# Patient Record
Sex: Male | Born: 1970 | Race: White | Hispanic: No
Health system: Midwestern US, Community
[De-identification: ages and names within clinical notes are randomized; demographics above are authoritative.]

## PROBLEM LIST (undated history)

## (undated) HISTORY — PX: APPENDECTOMY: SHX54

## (undated) HISTORY — PX: CHOLECYSTECTOMY: SHX55

---

## 2014-10-29 ENCOUNTER — Emergency Department (HOSPITAL_COMMUNITY)
Admission: EM | Admit: 2014-10-29 | Discharge: 2014-10-30 | Disposition: A | Discharge status: Home / Self Care | Attending: Emergency Medicine | Admitting: Emergency Medicine

## 2014-10-29 ENCOUNTER — Encounter (HOSPITAL_COMMUNITY): Payer: Self-pay | Admitting: Emergency Medicine

## 2014-10-29 DIAGNOSIS — L0201 Cutaneous abscess of face: Secondary | ICD-10-CM | POA: Diagnosis not present

## 2014-10-29 DIAGNOSIS — Z880 Allergy status to penicillin: Secondary | ICD-10-CM | POA: Diagnosis not present

## 2014-10-29 DIAGNOSIS — Z720 Tobacco use: Secondary | ICD-10-CM | POA: Diagnosis not present

## 2014-10-29 NOTE — ED Notes (Signed)
Pt c/o abscess to the rt side of mouth.

## 2014-10-29 NOTE — ED Provider Notes (Signed)
CSN: [id number]     Arrival date & time 10/29/14  2204 History  This chart was scribed for Devoria Albe, MD by Phillis Haggis, ED Scribe. This patient was seen in room APA17/APA17 and patient care was started at 11:38 PM.   Chief Complaint  Patient presents with  . Abscess   The history is provided by the patient. No language interpreter was used.  HPI Comments: Ralph Rivera is a 44 y.o. male who presents to the Emergency Department complaining of a gradually worsening wound to the bottom right of the mouth onset today. Pt states that he was helping a friend move in and was in the storage area underneath the house yesterday when he believes that he got bit by a spider, but did not feel a specific bite. States that he woke up with the bump on his face this morning that was initially a small red bump that has gotten worse and he can feel it on the inside of his mouth; states that it has been draining. Reports myalgias, sore throat, pain with swallowing, chills, but unsure of fever. Denies nausea or vomiting.    Reports smoking 5-10 cigarettes per day. 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 Denies daily medications.   States that he is from [place] and sees [facility] in [place].  History reviewed. No pertinent past medical history. Past Surgical History  Procedure Laterality Date  . Cholecystectomy     No family history on file. History  Substance Use Topics  . Smoking status: Current Every Day Smoker    Types: Cigarettes  . Smokeless tobacco: Not on file  . Alcohol Use: No  active duty [employer] Smokes 1/2 ppd  Review of Systems  Constitutional: Positive for fever and chills.  HENT: Positive for mouth sores and sore throat.   Gastrointestinal: Negative for nausea and vomiting.  Musculoskeletal: Positive for myalgias.  Skin: Positive for wound.  All other systems reviewed and are negative.   Allergies  Penicillins  Home Medications   Prior to Admission  medications   Medication Sig Start Date End Date Taking? Authorizing Provider  sulfamethoxazole-trimethoprim (BACTRIM DS,SEPTRA DS) 800-160 MG per tablet Take 1 tablet by mouth 2 (two) times daily. 10/30/14   Devoria Albe, MD   BP 151/84 mmHg  Pulse 91  Temp(Src) 99.1 F (37.3 C)  Resp 20  Ht  (1.778 m)  Wt 170 lb (77.111 kg)  BMI 24.39 kg/m2  SpO2 100%  Vital signs normal except for low-grade fever   Physical Exam  Constitutional: He is oriented to person, place, and time. He appears well-developed and well-nourished.  Non-toxic appearance. He does not appear ill. No distress.  HENT:  Head: Normocephalic and atraumatic.  Right Ear: External ear normal.  Left Ear: External ear normal.  Nose: Nose normal. No mucosal edema or rhinorrhea.  Mouth/Throat: Oropharynx is clear and moist and mucous membranes are normal. No dental abscesses or uvula swelling.  Purulent drainage and scabbed area to the right edge of mouth about 1 cm in size; no corresponding lesion to palpation on inside of mouth. There is no discrete induration in the subcutaneous tissue.  Eyes: Conjunctivae and EOM are normal. Pupils are equal, round, and reactive to light.  Neck: Full passive range of motion without pain.  Pulmonary/Chest: Effort normal. No respiratory distress. He has no rhonchi. He exhibits no crepitus.  Abdominal: Normal appearance.  Musculoskeletal: Normal range of motion.  Moves all extremities well.   Neurological:  He is alert and oriented to person, place, and time. He has normal strength. No cranial nerve deficit.  Skin: Skin is warm, dry and intact. No rash noted. No erythema. No pallor.  Psychiatric: He has a normal mood and affect. His speech is normal and behavior is normal. His mood appears not anxious.  Nursing note and vitals reviewed.      ED Course  Procedures (including critical care time)  Medications  sulfamethoxazole-trimethoprim (BACTRIM DS,SEPTRA DS) 800-160 MG per  tablet 1 tablet (not administered)  ibuprofen (ADVIL,MOTRIN) tablet 600 mg (not administered)  acetaminophen (TYLENOL) tablet 1,000 mg (not administered)    DIAGNOSTIC STUDIES: Oxygen Saturation is 100% on RA, normal by my interpretation.    COORDINATION OF CARE: 11:44 PM-Discussed treatment plan which includes wound culture, anti-biotics, and warm compresses with pt at bedside and pt agreed to plan.   Patient's wound was draining. I swabbed the drainage to obtain for culture. I discussed with the patient this is most likely a MRSA type infection. His temperature was rechecked and remained low-grade fever at 99.8. He was started on Septra DS.  Labs Review Labs Reviewed  WOUND CULTURE    Imaging Review No results found.   EKG Interpretation None      MDM   Final diagnoses:  Abscess of face   New Prescriptions   SULFAMETHOXAZOLE-TRIMETHOPRIM (BACTRIM DS,SEPTRA DS) 800-160 MG PER TABLET    Take 1 tablet by mouth 2 (two) times daily.    Plan discharge  Devoria AlbeIva Cassie Henkels, MD, FACEP  I personally performed the services described in this documentation, which was scribed in my presence. The recorded information has been reviewed and considered.  Devoria AlbeIva Lelania Bia, MD, Concha PyoFACEP    Nikai Quest, MD 10/30/14 [phone]

## 2014-10-30 MED ORDER — IBUPROFEN 400 MG PO TABS
600.0000 mg | ORAL_TABLET | Freq: Once | ORAL | Status: AC
  Administered 2014-10-30: 600 mg via ORAL
  Filled 2014-10-30: qty 2

## 2014-10-30 MED ORDER — SULFAMETHOXAZOLE-TRIMETHOPRIM 800-160 MG PO TABS: 1.0000 | ORAL_TABLET | Freq: Two times a day (BID) | ORAL | Status: DC

## 2014-10-30 MED ORDER — ACETAMINOPHEN 500 MG PO TABS
1000.0000 mg | ORAL_TABLET | Freq: Once | ORAL | Status: AC
  Administered 2014-10-30: 1000 mg via ORAL
  Filled 2014-10-30: qty 2

## 2014-10-30 MED ORDER — SULFAMETHOXAZOLE-TRIMETHOPRIM 800-160 MG PO TABS
1.0000 | ORAL_TABLET | Freq: Once | ORAL | Status: AC
  Administered 2014-10-30: 1 via ORAL
  Filled 2014-10-30: qty 1

## 2014-10-30 NOTE — ED Notes (Signed)
Pt left ED, ambulatory with no sign of distress. Pt verbalized discharge instructions. 

## 2014-10-30 NOTE — ED Notes (Signed)
Pt has a scab that has a small redden area encircling scab.

## 2014-10-30 NOTE — Discharge Instructions (Signed)
Use warm compresses on the area. Take the antibiotic until gone. You are having a low grade fever. You can take ibuprofen 600 mg + acetaminophen 1000 mg 4 times a day for aches and fever.   Recheck if the area gets more swollen, you get  high fevers, have difficulty swallowing, breathing or seem worse in any way.    Abscess An abscess is an infected area that contains a collection of pus and debris.It can occur in almost any part of the body. An abscess is also known as a furuncle or boil. CAUSES  An abscess occurs when tissue gets infected. This can occur from blockage of oil or sweat glands, infection of hair follicles, or a minor injury to the skin. As the body tries to fight the infection, pus collects in the area and creates pressure under the skin. This pressure causes pain. People with weakened immune systems have difficulty fighting infections and get certain abscesses more often.  SYMPTOMS Usually an abscess develops on the skin and becomes a painful mass that is red, warm, and tender. If the abscess forms under the skin, you may feel a moveable soft area under the skin. Some abscesses break open (rupture) on their own, but most will continue to get worse without care. The infection can spread deeper into the body and eventually into the bloodstream, causing you to feel ill.  DIAGNOSIS  Your caregiver will take your medical history and perform a physical exam. A sample of fluid may also be taken from the abscess to determine what is causing your infection. TREATMENT  Your caregiver may prescribe antibiotic medicines to fight the infection. However, taking antibiotics alone usually does not cure an abscess. Your caregiver may need to make a small cut (incision) in the abscess to drain the pus. In some cases, gauze is packed into the abscess to reduce pain and to continue draining the area. HOME CARE INSTRUCTIONS   Only take over-the-counter or prescription medicines for pain, discomfort, or  fever as directed by your caregiver.  If you were prescribed antibiotics, take them as directed. Finish them even if you start to feel better.  If gauze is used, follow your caregiver's directions for changing the gauze.  To avoid spreading the infection:  Keep your draining abscess covered with a bandage.  Wash your hands well.  Do not share personal care items, towels, or whirlpools with others.  Avoid skin contact with others.  Keep your skin and clothes clean around the abscess.  Keep all follow-up appointments as directed by your caregiver. SEEK MEDICAL CARE IF:   You have increased pain, swelling, redness, fluid drainage, or bleeding.  You have muscle aches, chills, or a general ill feeling.  You have a fever. MAKE SURE YOU:   Understand these instructions.  Will watch your condition.  Will get help right away if you are not doing well or get worse. Document Released: 01/05/2005 Document Revised: 09/27/2011 Document Reviewed: 06/10/2011 [facility] Patient Information 2015 [place], [place]. This information is not intended to replace advice given to you by your health care provider. Make sure you discuss any questions you have with your health care provider.

## 2014-11-02 LAB — WOUND CULTURE

## 2014-11-03 ENCOUNTER — Telehealth (HOSPITAL_COMMUNITY): Payer: Self-pay

## 2014-11-03 NOTE — ED Notes (Signed)
Post ED Visit - Positive Culture Follow-up  Culture report reviewed by antimicrobial stewardship pharmacist:  Wes Dulaney, Pharm.D., BCPS  Celedonio Miyamoto, Pharm.D., BCPS  Georgina Pillion, Pharm.D., BCPS  [place], [address].D., BCPS, AAHIVP  Estella Husk, Pharm.D., BCPS, AAHIVP  Elder Cyphers, [address].D., BCPS X Cassie Stewart Pharm D  Positive wound culture Treated with bactrim ds, organism sensitive to the same and no further patient follow-up is required at this time.  Ashley Jacobs 11/03/2014, 10:14 AM

## 2016-12-16 ENCOUNTER — Encounter (HOSPITAL_COMMUNITY): Payer: Self-pay | Admitting: Emergency Medicine

## 2016-12-16 ENCOUNTER — Emergency Department (HOSPITAL_COMMUNITY)
Admission: EM | Admit: 2016-12-16 | Discharge: 2016-12-16 | Disposition: A | Discharge status: Home / Self Care | Attending: Emergency Medicine | Admitting: Emergency Medicine

## 2016-12-16 DIAGNOSIS — L0231 Cutaneous abscess of buttock: Secondary | ICD-10-CM | POA: Insufficient documentation

## 2016-12-16 DIAGNOSIS — Z79899 Other long term (current) drug therapy: Secondary | ICD-10-CM | POA: Diagnosis not present

## 2016-12-16 DIAGNOSIS — F17210 Nicotine dependence, cigarettes, uncomplicated: Secondary | ICD-10-CM | POA: Diagnosis not present

## 2016-12-16 MED ORDER — DOXYCYCLINE HYCLATE 100 MG PO CAPS: 100.0000 mg | ORAL_CAPSULE | Freq: Two times a day (BID) | ORAL | 0 refills | Status: AC

## 2016-12-16 NOTE — ED Triage Notes (Signed)
Pt from home with c/o possible abscess to left buttock. Pt states area has been inflamed since it was drained 5 days ago. Pt states he was told it was possibly an infected spider bite and he was put on bactrim. Pt states he is changing his dressing 2 times daily and is seeing grey, yellow, and red drainage.

## 2016-12-16 NOTE — ED Provider Notes (Signed)
[facility]-EMERGENCY DEPT Provider Note   CSN: [id number] Arrival date & time: 12/16/16  1249     History   Chief Complaint Chief Complaint  Patient presents with  . Abscess    HPI Ralph Rivera is a 46 y.o. male.  HPI   46 year old male here with concern of a buttock abscess. Pt report 5 days ago he developed an abscess to L buttock from a suspected insect bite.  He was evaluated at an outside facility.  He had I&D procedure, with packing and was discharged with Bactrim.  He had his packing removed several days later.  He is still having drainage and pain from the same area, but better than before.  He denies fever, rectal discomfort, difficulty having BMs.  He is a smoker, denies hx of diabetes or immune compromise state.  He is UTD with immunization.   History reviewed. No pertinent past medical history.  There are no active problems to display for this patient.   Past Surgical History:  Procedure Laterality Date  . APPENDECTOMY    . CHOLECYSTECTOMY         Home Medications    Prior to Admission medications   Medication Sig Start Date End Date Taking? Authorizing Provider  ibuprofen (ADVIL,MOTRIN) 800 MG tablet Take 800 mg by mouth every 6 (six) hours as needed for mild pain.   Yes [provider]  sertraline (ZOLOFT) 50 MG tablet Take 50 mg by mouth daily. 12/05/16  Yes [provider]  sulfamethoxazole-trimethoprim (BACTRIM DS,SEPTRA DS) 800-160 MG tablet Take 1 tablet by mouth 2 (two) times daily.   Yes [provider]    Family History No family history on file.  Social History Social History  Substance Use Topics  . Smoking status: Current Every Day Smoker    Types: Cigarettes  . Smokeless tobacco: Not on file  . Alcohol use No     Allergies   Penicillins   Review of Systems Review of Systems  Constitutional: Negative for fever.  Skin: Positive for wound. Negative for rash.  Neurological: Negative for numbness.      Physical Exam Updated Vital Signs BP 137/79   Pulse 69   Temp 98.5 F (36.9 C) (Oral)   Resp 16   Ht 5\' 11"  (1.803 m)   Wt 77.1 kg (170 lb)   SpO2 99%   BMI 23.71 kg/m   Physical Exam  Constitutional: He appears well-developed and well-nourished. No distress.  HENT:  Head: Atraumatic.  Eyes: Conjunctivae are normal.  Neck: Neck supple.  Genitourinary:  Genitourinary Comments: Chaperone present during exam.  L buttock with a visible site of recent I&D.  I was able to express a small amount of pus with direct pressure.  No surrounding skin erythema, no significant induration or fluctuance.  No rectal involvement.   Neurological: He is alert.  Skin: No rash noted.  Psychiatric: He has a normal mood and affect.  Nursing note and vitals reviewed.    ED Treatments / Results  Labs (all labs ordered are listed, but only abnormal results are displayed) Labs Reviewed - No data to display  EKG  EKG Interpretation None       Radiology No results found.  Procedures Procedures (including critical care time)  Medications Ordered in ED Medications - No data to display   Initial Impression / Assessment and Plan / ED Course  I have reviewed the triage vital signs and the nursing notes.  Pertinent labs & imaging results  that were available during my care of the patient were reviewed by me and considered in my medical decision making (see chart for details).     BP 137/79   Pulse 69   Temp 98.5 F (36.9 C) (Oral)   Resp 16   Ht  (1.803 m)   Wt 77.1 kg (170 lb)   SpO2 99%   BMI 23.71 kg/m    Final Clinical Impressions(s) / ED Diagnoses   Final diagnoses:  Abscess of buttock, left    New Prescriptions New Prescriptions   DOXYCYCLINE (VIBRAMYCIN) 100 MG CAPSULE    Take 1 capsule (100 mg total) by mouth 2 (two) times daily. One po bid x 7 days   3:59 PM Pt here for evaluation of recent L buttock abscess that was I&D a few days prior.  The incision  is wide enough that i'm able to express some pustular discharge with direct pressure.  No evidence of cellulitis.  No rectal involvement.  I do not think additional I&D will yield much.  I will place pt on Doxy to go along with Bactrim, recommend warm compress and to try to apply pressure to promote drainage.  Return precaution given.  Pt voice understanding and agrees with plan.    Fayrene Helper, PA-C 12/16/16 1610    Tegeler, Canary Brim, MD 12/16/16 [phone]

## 2017-05-29 ENCOUNTER — Emergency Department (HOSPITAL_COMMUNITY)
Admission: EM | Admit: 2017-05-29 | Discharge: 2017-05-29 | Disposition: A | Discharge status: Home / Self Care | Attending: Emergency Medicine | Admitting: Emergency Medicine

## 2017-05-29 ENCOUNTER — Encounter (HOSPITAL_COMMUNITY): Payer: Self-pay | Admitting: Emergency Medicine

## 2017-05-29 ENCOUNTER — Other Ambulatory Visit: Payer: Self-pay

## 2017-05-29 DIAGNOSIS — B9789 Other viral agents as the cause of diseases classified elsewhere: Secondary | ICD-10-CM | POA: Insufficient documentation

## 2017-05-29 DIAGNOSIS — R05 Cough: Secondary | ICD-10-CM | POA: Diagnosis present

## 2017-05-29 DIAGNOSIS — F17210 Nicotine dependence, cigarettes, uncomplicated: Secondary | ICD-10-CM | POA: Diagnosis not present

## 2017-05-29 DIAGNOSIS — J069 Acute upper respiratory infection, unspecified: Secondary | ICD-10-CM

## 2017-05-29 NOTE — ED Notes (Addendum)
Pt alert and oriented x 4 and is verbally responsive . Pt reports productive cough with yellow sputum x 4 days and with fever 103.0 Ibuprofen. Pt reports that he has some middle chest aches that worsens with cough and is aches. Pt reports some nasal congestion and mild sore throat.

## 2017-05-29 NOTE — ED Provider Notes (Signed)
[facility]-EMERGENCY DEPT Provider Note   CSN: [id number] Arrival date & time: 05/29/17  1448     History   Chief Complaint Chief Complaint  Patient presents with  . Cough    HPI Ralph Rivera is a 47 y.o. male presenting to ED with acute onset of cough, congestion and ntermittent fever x 2days. Cough is persistent with some yellow sputum. Feels as though his chest is feeling sore from coughing. Reports assoc mild sore throat. Has been taking advil and theraflu for sx with relief of fever. Tmax 103F. Positive sick contacts. Has had influenza vaccine this year. Also reports inc urinary freq and some dysuria intermittently x3 weeks. Denies difficulty swallowing or breathing, abdominal complaints, flank pain, or other complaints.  The history is provided by the patient.    History reviewed. No pertinent past medical history.  There are no active problems to display for this patient.   Past Surgical History:  Procedure Laterality Date  . APPENDECTOMY    . CHOLECYSTECTOMY         Home Medications    Prior to Admission medications   Medication Sig Start Date End Date Taking? Authorizing Provider  ibuprofen (ADVIL,MOTRIN) 800 MG tablet Take 800 mg by mouth every 6 (six) hours as needed for mild pain.   Yes [provider]  sertraline (ZOLOFT) 50 MG tablet Take 50 mg by mouth daily. 12/05/16  Yes [provider]  doxycycline (VIBRAMYCIN) 100 MG capsule Take 1 capsule (100 mg total) by mouth 2 (two) times daily. One po bid x 7 days Patient not taking: Reported on 05/29/2017 12/16/16   Fayrene Helperran, [place], PA-C  sulfamethoxazole-trimethoprim (BACTRIM DS,SEPTRA DS) 800-160 MG tablet Take 1 tablet by mouth 2 (two) times daily.    [provider]    Family History No family history on file.  Social History Social History   Tobacco Use  . Smoking status: Current Every Day Smoker    Types: Cigarettes  Substance Use Topics  . Alcohol use: No   . Drug use: No     Allergies   Penicillins   Review of Systems Review of Systems  Constitutional: Positive for fever.  HENT: Positive for congestion and sore throat. Negative for trouble swallowing and voice change.   Respiratory: Positive for cough and chest tightness. Negative for shortness of breath.   Cardiovascular: Negative for chest pain.  Gastrointestinal: Negative for abdominal pain, constipation, diarrhea, nausea and vomiting.  Genitourinary: Positive for dysuria and frequency. Negative for discharge, flank pain and penile pain.  All other systems reviewed and are negative.    Physical Exam Updated Vital Signs BP (!) 144/84 (BP Location: Left Arm)   Pulse 85   Temp 99 F (37.2 C) (Oral)   Resp 16   Ht 5\' 10"  (1.778 m)   Wt 83.7 kg (184 lb 9 oz)   SpO2 100%   BMI 26.48 kg/m   Physical Exam  Constitutional: He appears well-developed and well-nourished. No distress.  Well-appearing, tolerating secretions  HENT:  Head: Normocephalic and atraumatic.  Right Ear: Tympanic membrane, external ear and ear canal normal.  Left Ear: Tympanic membrane, external ear and ear canal normal.  Mouth/Throat: Uvula is midline. No trismus in the jaw. No uvula swelling. Posterior oropharyngeal erythema present. No posterior oropharyngeal edema. No tonsillar exudate.  Nose is congested  Eyes: Conjunctivae are normal.  Neck: Normal range of motion. Neck supple.  Cardiovascular: Normal rate, regular rhythm, normal heart sounds and intact distal pulses.  Pulmonary/Chest: Effort normal and breath sounds normal. No stridor. No respiratory distress. He has no wheezes. He has no rales.  Abdominal: Soft. Bowel sounds are normal. He exhibits no distension. There is no tenderness.  No CVA  Lymphadenopathy:    He has no cervical adenopathy.  Neurological: He is alert.  Skin: Skin is warm.  Psychiatric: He has a normal mood and affect. His behavior is normal.  Nursing note and vitals  reviewed.    ED Treatments / Results  Labs (all labs ordered are listed, but only abnormal results are displayed) Labs Reviewed - No data to display  EKG  EKG Interpretation None       Radiology No results found.  Procedures Procedures (including critical care time)  Medications Ordered in ED Medications - No data to display   Initial Impression / Assessment and Plan / ED Course  I have reviewed the triage vital signs and the nursing notes.  Pertinent labs & imaging results that were available during my care of the patient were reviewed by me and considered in my medical decision making (see chart for details).     Patients symptoms are consistent with URI, likely viral etiology. Discussed possibility of influenza, though sx duration is outside of treatment window for tamiflu. Discussed that antibiotics are not indicated for viral infections. Afebrile in ED, lungs CTAB. Well-appearing, not in distress. Also reported urinary sx x 3 weeks but declines U/A and states he will follow up with his PCP. Pt will be discharged with symptomatic treatment.  Verbalizes understanding and is agreeable with plan. Pt is hemodynamically stable & in NAD prior to dc.  Discussed results, findings, treatment and follow up. Patient advised of return precautions. Patient verbalized understanding and agreed with plan.  Final Clinical Impressions(s) / ED Diagnoses   Final diagnoses:  Viral URI with cough    ED Discharge Orders    None       Robinson, [place] N, PA-C 05/29/17 1830    Lorre Nick, MD 05/31/17 [phone]

## 2017-05-29 NOTE — Discharge Instructions (Signed)
Please read instructions below. You can take tylenol and ibuprofen as needed for fever, sore throat or body aches. Drink plenty of water. Follow up with your primary care provider to follow up on your urinary symptoms, and also for re-evaluation if symptoms persist. Return to the ER for difficulty swallowing liquids, difficulty breathing, or new or worsening symptoms.

## 2017-05-29 NOTE — ED Triage Notes (Signed)
Patient c/o cough and congestion with intermittent fever x2 days. Denies N/V/D. Reports last dose ibuprofen this morning.

## 2018-07-13 ENCOUNTER — Emergency Department (HOSPITAL_COMMUNITY)
Admission: EM | Admit: 2018-07-13 | Discharge: 2018-07-13 | Disposition: A | Discharge status: Home / Self Care | Attending: Emergency Medicine | Admitting: Emergency Medicine

## 2018-07-13 ENCOUNTER — Other Ambulatory Visit: Payer: Self-pay

## 2018-07-13 ENCOUNTER — Encounter (HOSPITAL_COMMUNITY): Payer: Self-pay

## 2018-07-13 ENCOUNTER — Emergency Department (HOSPITAL_COMMUNITY)

## 2018-07-13 DIAGNOSIS — B9789 Other viral agents as the cause of diseases classified elsewhere: Secondary | ICD-10-CM

## 2018-07-13 DIAGNOSIS — Z79899 Other long term (current) drug therapy: Secondary | ICD-10-CM | POA: Diagnosis not present

## 2018-07-13 DIAGNOSIS — R0602 Shortness of breath: Secondary | ICD-10-CM | POA: Diagnosis present

## 2018-07-13 DIAGNOSIS — B349 Viral infection, unspecified: Secondary | ICD-10-CM | POA: Insufficient documentation

## 2018-07-13 DIAGNOSIS — F17210 Nicotine dependence, cigarettes, uncomplicated: Secondary | ICD-10-CM | POA: Diagnosis not present

## 2018-07-13 DIAGNOSIS — J988 Other specified respiratory disorders: Secondary | ICD-10-CM

## 2018-07-13 NOTE — Discharge Instructions (Addendum)
We are presuming at this time that you have COVID-19 given the prevalence in this area. You could have any other type of viral illness as well. If you develop worsening shortness of breath, vomiting, or any other new/concerning symptoms then return to this or any other ER for evaluation.  Otherwise you need to self quarantine for the next 7 days and be symptom-free for at least 3 days before you can stop.     Person Under Monitoring Name: Ralph Rivera  Location: [address] [place] [address]   Infection Prevention Recommendations for Individuals Confirmed to have, or Being Evaluated for, 2019 Novel Coronavirus (COVID-19) Infection Who Receive Care at Home  Individuals who are confirmed to have, or are being evaluated for, COVID-19 should follow the prevention steps below until a healthcare provider or local or state health department says they can return to normal activities.  Stay home except to get medical care You should restrict activities outside your home, except for getting medical care. Do not go to work, school, or public areas, and do not use public transportation or taxis.  Call ahead before visiting your doctor Before your medical appointment, call the healthcare provider and tell them that you have, or are being evaluated for, COVID-19 infection. This will help the healthcare providers office take steps to keep other people from getting infected. Ask your healthcare provider to call the local or state health department.  Monitor your symptoms Seek prompt medical attention if your illness is worsening (e.g., difficulty breathing). Before going to your medical appointment, call the healthcare provider and tell them that you have, or are being evaluated for, COVID-19 infection. Ask your healthcare provider to call the local or state health department.  Wear a facemask You should wear a facemask that covers your nose and mouth when you are in the same room with other  people and when you visit a healthcare provider. People who live with or visit you should also wear a facemask while they are in the same room with you.  Separate yourself from other people in your home As much as possible, you should stay in a different room from other people in your home. Also, you should use a separate bathroom, if available.  Avoid sharing household items You should not share dishes, drinking glasses, cups, eating utensils, towels, bedding, or other items with other people in your home. After using these items, you should wash them thoroughly with soap and water.  Cover your coughs and sneezes Cover your mouth and nose with a tissue when you cough or sneeze, or you can cough or sneeze into your sleeve. Throw used tissues in a lined trash can, and immediately wash your hands with soap and water for at least 20 seconds or use an alcohol-based hand rub.  Wash your [employer] your hands often and thoroughly with soap and water for at least 20 seconds. You can use an alcohol-based hand sanitizer if soap and water are not available and if your hands are not visibly dirty. Avoid touching your eyes, nose, and mouth with unwashed hands.   Prevention Steps for Caregivers and Household Members of Individuals Confirmed to have, or Being Evaluated for, COVID-19 Infection Being Cared for in the Home  If you live with, or provide care at home for, a person confirmed to have, or being evaluated for, COVID-19 infection please follow these guidelines to prevent infection:  Follow healthcare providers instructions Make sure that you understand and can help  the patient follow any healthcare provider instructions for all care.  Provide for the patients basic needs You should help the patient with basic needs in the home and provide support for getting groceries, prescriptions, and other personal needs.  Monitor the patients symptoms If they are getting sicker, call his or her  medical provider and tell them that the patient has, or is being evaluated for, COVID-19 infection. This will help the healthcare providers office take steps to keep other people from getting infected. Ask the healthcare provider to call the local or state health department.  Limit the number of people who have contact with the patient If possible, have only one caregiver for the patient. Other household members should stay in another home or place of residence. If this is not possible, they should stay in another room, or be separated from the patient as much as possible. Use a separate bathroom, if available. Restrict visitors who do not have an essential need to be in the home.  Keep older adults, very young children, and other sick people away from the patient Keep older adults, very young children, and those who have compromised immune systems or chronic health conditions away from the patient. This includes people with chronic heart, lung, or kidney conditions, diabetes, and cancer.  Ensure good ventilation Make sure that shared spaces in the home have good air flow, such as from an air conditioner or an opened window, weather permitting.  Wash your hands often Wash your hands often and thoroughly with soap and water for at least 20 seconds. You can use an alcohol based hand sanitizer if soap and water are not available and if your hands are not visibly dirty. Avoid touching your eyes, nose, and mouth with unwashed hands. Use disposable paper towels to dry your hands. If not available, use dedicated cloth towels and replace them when they become wet.  Wear a facemask and gloves Wear a disposable facemask at all times in the room and gloves when you touch or have contact with the patients blood, body fluids, and/or secretions or excretions, such as sweat, saliva, sputum, nasal mucus, vomit, urine, or feces.  Ensure the mask fits over your nose and mouth tightly, and do not touch it  during use. Throw out disposable facemasks and gloves after using them. Do not reuse. Wash your hands immediately after removing your facemask and gloves. If your personal clothing becomes contaminated, carefully remove clothing and launder. Wash your hands after handling contaminated clothing. Place all used disposable facemasks, gloves, and other waste in a lined container before disposing them with other household waste. Remove gloves and wash your hands immediately after handling these items.  Do not share dishes, glasses, or other household items with the patient Avoid sharing household items. You should not share dishes, drinking glasses, cups, eating utensils, towels, bedding, or other items with a patient who is confirmed to have, or being evaluated for, COVID-19 infection. After the person uses these items, you should wash them thoroughly with soap and water.  Wash laundry thoroughly Immediately remove and wash clothes or bedding that have blood, body fluids, and/or secretions or excretions, such as sweat, saliva, sputum, nasal mucus, vomit, urine, or feces, on them. Wear gloves when handling laundry from the patient. Read and follow directions on labels of laundry or clothing items and detergent. In general, wash and dry with the warmest temperatures recommended on the label.  Clean all areas the individual has used often Clean all touchable surfaces,  such as counters, tabletops, doorknobs, bathroom fixtures, toilets, phones, keyboards, tablets, and bedside tables, every day. Also, clean any surfaces that may have blood, body fluids, and/or secretions or excretions on them. Wear gloves when cleaning surfaces the patient has come in contact with. Use a diluted bleach solution (e.g., dilute bleach with 1 part bleach and 10 parts water) or a household disinfectant with a label that says EPA-registered for coronaviruses. To make a bleach solution at home, add 1 tablespoon of bleach to 1  quart (4 cups) of water. For a larger supply, add  cup of bleach to 1 gallon (16 cups) of water. Read labels of cleaning products and follow recommendations provided on product labels. Labels contain instructions for safe and effective use of the cleaning product including precautions you should take when applying the product, such as wearing gloves or eye protection and making sure you have good ventilation during use of the product. Remove gloves and wash hands immediately after cleaning.  Monitor yourself for signs and symptoms of illness Caregivers and household members are considered close contacts, should monitor their health, and will be asked to limit movement outside of the home to the extent possible. Follow the monitoring steps for close contacts listed on the symptom monitoring form.   ? If you have additional questions, contact your local health department or call the epidemiologist on call at [phone] (available 24/7). ? This guidance is subject to change. For the most up-to-date guidance from [facility], please refer to their website: [email or web address]

## 2018-07-13 NOTE — ED Provider Notes (Signed)
[facility]-EMERGENCY DEPT Provider Note   CSN: [id number] Arrival date & time: 07/13/18  1034    History   Chief Complaint Chief Complaint  Patient presents with  . Shortness of Breath  . Cough    HPI Ralph Rivera is a 48 y.o. male.     HPI  48 year old male presents with cough, chest pain and shortness of breath.  Started about 3 days ago with a cough.  Yesterday he had a low-grade temperature of 100.2.  He is had a little bit of aches.  He feels mild shortness of breath and some chest pain when breathing in.  The cough is a little bit of yellow sputum.  No specific contacts with COVID-19 patient.  He lives in [place].  Feels like when he has had pneumonia before.  No leg swelling.  History reviewed. No pertinent past medical history.  There are no active problems to display for this patient.   Past Surgical History:  Procedure Laterality Date  . APPENDECTOMY    . CHOLECYSTECTOMY          Home Medications    Prior to Admission medications   Medication Sig Start Date End Date Taking? Authorizing Provider  doxycycline (VIBRAMYCIN) 100 MG capsule Take 1 capsule (100 mg total) by mouth 2 (two) times daily. One po bid x 7 days Patient not taking: Reported on 05/29/2017 12/16/16   Fayrene Helper, PA-C  ibuprofen (ADVIL,MOTRIN) 800 MG tablet Take 800 mg by mouth every 6 (six) hours as needed for mild pain.    [provider]  sertraline (ZOLOFT) 50 MG tablet Take 50 mg by mouth daily. 12/05/16   [provider]  sulfamethoxazole-trimethoprim (BACTRIM DS,SEPTRA DS) 800-160 MG tablet Take 1 tablet by mouth 2 (two) times daily.    [provider]    Family History History reviewed. No pertinent family history.  Social History Social History   Tobacco Use  . Smoking status: Current Every Day Smoker    Types: Cigarettes  Substance Use Topics  . Alcohol use: No  . Drug use: No     Allergies   Penicillins   Review of  Systems Review of Systems  Constitutional: Positive for fever.  Respiratory: Positive for cough and shortness of breath.   Cardiovascular: Positive for chest pain. Negative for leg swelling.  Gastrointestinal: Negative for vomiting.  Musculoskeletal: Positive for myalgias.  All other systems reviewed and are negative.    Physical Exam Updated Vital Signs BP 127/87 (BP Location: Right Arm)   Pulse 92   Temp 98.6 F (37 C) (Oral)   Resp 18   SpO2 100%   Physical Exam Vitals signs and nursing note reviewed.  Constitutional:      Appearance: He is well-developed.  HENT:     Head: Normocephalic and atraumatic.     Right Ear: External ear normal.     Left Ear: External ear normal.     Nose: Nose normal.  Eyes:     General:        Right eye: No discharge.        Left eye: No discharge.  Neck:     Musculoskeletal: Neck supple.  Cardiovascular:     Rate and Rhythm: Normal rate and regular rhythm.     Heart sounds: Normal heart sounds.  Pulmonary:     Effort: Pulmonary effort is normal.     Breath sounds: Normal breath sounds. No decreased breath sounds, wheezing, rhonchi or rales.  Chest:  Chest wall: Tenderness (mid-anterior chest, mild) present.  Abdominal:     Palpations: Abdomen is soft.     Tenderness: There is no abdominal tenderness.  Skin:    General: Skin is warm and dry.  Neurological:     Mental Status: He is alert.  Psychiatric:        Mood and Affect: Mood is not anxious.      ED Treatments / Results  Labs (all labs ordered are listed, but only abnormal results are displayed) Labs Reviewed - No data to display  EKG EKG Interpretation  Date/Time:  Friday July 13 2018 11:38:09 EDT Ventricular Rate:  86 PR Interval:    QRS Duration: 85 QT Interval:  372 QTC Calculation: 445 R Axis:   90 Text Interpretation:  Normal sinus rhythm Borderline right axis deviation no acute ST/T changes No old tracing to compare Confirmed by Pricilla Loveless  [phone]) on 07/13/2018 11:43:42 AM   Radiology Dg Chest Portable 1 View  Result Date: 07/13/2018 CLINICAL DATA:  Chest pain, cough, and shortness of breath. EXAM: PORTABLE CHEST 1 VIEW COMPARISON:  None. FINDINGS: The heart size and mediastinal contours are within normal limits. Both lungs are clear. The visualized skeletal structures are unremarkable. IMPRESSION: No active disease. Electronically Signed   By: Obie Dredge M.D.   On: 07/13/2018 11:34    Procedures Procedures (including critical care time)  Medications Ordered in ED Medications - No data to display   Initial Impression / Assessment and Plan / ED Course  I have reviewed the triage vital signs and the nursing notes.  Pertinent labs & imaging results that were available during my care of the patient were reviewed by me and considered in my medical decision making (see chart for details).        Patient has no increased work of breathing and has normal O2 sats.  His vital signs are benign.  With no tachycardia and normal O2 sats, PE is very unlikely with no other risk factors.  Otherwise, seems to have a viral illness and then the current pandemic, consider COVID-19.  Given he is not ill, I will recommend he self quarantine.  There is no pneumonia on chest x-ray.  Return precautions.  Ralph Rivera was evaluated in Emergency Department on 07/13/2018 for the symptoms described in the history of present illness. He was evaluated in the context of the global COVID-19 pandemic, which necessitated consideration that the patient might be at risk for infection with the SARS-CoV-2 virus that causes COVID-19. Institutional protocols and algorithms that pertain to the evaluation of patients at risk for COVID-19 are in a state of rapid change based on information released by regulatory bodies including the CDC and federal and state organizations. These policies and algorithms were followed during the patient's care in the ED.   Final  Clinical Impressions(s) / ED Diagnoses   Final diagnoses:  Viral respiratory illness    ED Discharge Orders    None       Pricilla Loveless, MD 07/13/18 1157

## 2018-07-13 NOTE — ED Triage Notes (Signed)
Pt reports cough x3-4 days. Pt reports cough became worse and started noticing SHOB and mild pain in chest when breathing in yesterday. Pt lives in [place] but works in [place]. Pt reports having chills at home but is afebrile in triage.

## 2019-05-14 ENCOUNTER — Encounter (HOSPITAL_COMMUNITY): Payer: Self-pay

## 2019-05-14 ENCOUNTER — Other Ambulatory Visit: Payer: Self-pay

## 2019-05-14 ENCOUNTER — Emergency Department (HOSPITAL_COMMUNITY)
Admission: EM | Admit: 2019-05-14 | Discharge: 2019-05-14 | Disposition: A | Discharge status: Home / Self Care | Attending: Emergency Medicine | Admitting: Emergency Medicine

## 2019-05-14 DIAGNOSIS — M5441 Lumbago with sciatica, right side: Secondary | ICD-10-CM | POA: Insufficient documentation

## 2019-05-14 DIAGNOSIS — G8929 Other chronic pain: Secondary | ICD-10-CM | POA: Insufficient documentation

## 2019-05-14 DIAGNOSIS — M79604 Pain in right leg: Secondary | ICD-10-CM | POA: Diagnosis present

## 2019-05-14 LAB — URINALYSIS, ROUTINE W REFLEX MICROSCOPIC
Bilirubin Urine: NEGATIVE
Glucose, UA: NEGATIVE mg/dL
Hgb urine dipstick: NEGATIVE
Ketones, ur: 5 mg/dL — AB
Leukocytes,Ua: NEGATIVE
Nitrite: NEGATIVE
Protein, ur: NEGATIVE mg/dL
Specific Gravity, Urine: 1.014 (ref 1.005–1.030)
pH: 5 (ref 5.0–8.0)

## 2019-05-14 MED ORDER — PREDNISONE 10 MG (21) PO TBPK: ORAL_TABLET | Freq: Every day | ORAL | 0 refills | Status: AC

## 2019-05-14 MED ORDER — OXYCODONE-ACETAMINOPHEN 5-325 MG PO TABS
1.0000 | ORAL_TABLET | Freq: Once | ORAL | Status: AC
  Administered 2019-05-14: 1 via ORAL
  Filled 2019-05-14: qty 1

## 2019-05-14 NOTE — ED Notes (Signed)
Pt verbalizes understanding of DC instructions. Pt belongings returned and is ambulatory out of ED.  

## 2019-05-14 NOTE — ED Provider Notes (Signed)
[facility]-EMERGENCY DEPT Provider Note   CSN: [id number] Arrival date & time: 05/14/19  1052     History Chief Complaint  Patient presents with  . Back Pain    Ralph Rivera is a 49 y.o. male.  HPI Patient is a 49 year old male with a history of L4/L5 bulging disc disease, chronic back pain, status post cholecystectomy/appendectomy.  Patient complaining today of right lower leg pain that radiates down the right leg as well as towards his right lower pelvis.  He states this is similar to pain he has had in the past however states that he is worse today.  Patient denies any fevers, blood thinner use, personal cancer history, IV drug use, bowel or bladder incontinence, saddle anesthesia.  He denies any numbness or weakness in his legs.  States that he has taken some hydrocodone at home without relief.  States that he is followed by an orthopedist in [place].  States that he had CT scans of his back done within the past year showed some mild spinal stenosis secondary to the bulging disc but was told that it was very minimal not requiring intervention at that time.  He states he is going back to [place] in the next week and will follow up with his orthopedics however he is requesting symptomatic relief at this time.     History reviewed. No pertinent past medical history.  There are no problems to display for this patient.   Past Surgical History:  Procedure Laterality Date  . APPENDECTOMY    . CHOLECYSTECTOMY         Family History  Problem Relation Age of Onset  . Healthy Mother   . Healthy Father     Social History   Tobacco Use  . Smoking status: Current Every Day Smoker    Packs/day: 0.50    Types: Cigarettes  . Smokeless tobacco: Never Used  Substance Use Topics  . Alcohol use: No  . Drug use: No    Home Medications Prior to Admission medications   Medication Sig Start Date End Date Taking? Authorizing Provider  doxycycline (VIBRAMYCIN)  100 MG capsule Take 1 capsule (100 mg total) by mouth 2 (two) times daily. One po bid x 7 days Patient not taking: Reported on 05/29/2017 12/16/16   Fayrene Helper, PA-C  ibuprofen (ADVIL,MOTRIN) 800 MG tablet Take 800 mg by mouth every 6 (six) hours as needed for mild pain.    [provider]  predniSONE (STERAPRED UNI-PAK 21 TAB) 10 MG (21) TBPK tablet Take by mouth daily. Take 6 tabs by mouth daily  for 2 days, then 5 tabs for 2 days, then 4 tabs for 2 days, then 3 tabs for 2 days, 2 tabs for 2 days, then 1 tab by mouth daily for 2 days 05/14/19   Solon Augusta S, PA  sertraline (ZOLOFT) 50 MG tablet Take 50 mg by mouth daily. 12/05/16   [provider]  sulfamethoxazole-trimethoprim (BACTRIM DS,SEPTRA DS) 800-160 MG tablet Take 1 tablet by mouth 2 (two) times daily.    [provider]    Allergies    Penicillins  Review of Systems   Review of Systems  Constitutional: Negative for chills and fever.  HENT: Negative for congestion.   Eyes: Negative for pain.  Respiratory: Negative for cough and shortness of breath.   Cardiovascular: Negative for chest pain and leg swelling.  Gastrointestinal: Negative for abdominal pain and vomiting.  Genitourinary: Negative for dysuria.  Musculoskeletal: Negative for myalgias.  Low back, right-sided sciatica  Skin: Negative for rash.  Neurological: Negative for dizziness and headaches.    Physical Exam Updated Vital Signs BP 130/90   Pulse 65   Temp 97.6 F (36.4 C) (Oral)   Resp 18   Ht 5\' 10"  (1.778 m)   Wt 79.4 kg   SpO2 100%   BMI 25.11 kg/m   Physical Exam Vitals and nursing note reviewed.  Constitutional:      General: He is not in acute distress. HENT:     Head: Normocephalic and atraumatic.     Nose: Nose normal.  Eyes:     General: No scleral icterus. Cardiovascular:     Rate and Rhythm: Normal rate and regular rhythm.     Pulses: Normal pulses.     Heart sounds: Normal heart sounds.  Pulmonary:      Effort: Pulmonary effort is normal. No respiratory distress.     Breath sounds: No wheezing.  Abdominal:     Palpations: Abdomen is soft.     Tenderness: There is no abdominal tenderness.  Musculoskeletal:     Cervical back: Normal range of motion.     Right lower leg: No edema.     Left lower leg: No edema.     Comments: Palpable right lower back tenderness to palpation with palpation to palpation produced patient symptoms.  No step-off deformity.  No bruising, abrasion or laceration.  Lower extremity strength bilaterally 5/5 with knee flexion-extension, ankle flexion-extension, hip  Skin:    General: Skin is warm and dry.     Capillary Refill: Capillary refill takes less than 2 seconds.  Neurological:     Mental Status: He is alert. Mental status is at baseline.     Comments: Sensation grossly intact bilateral lower extremities.  Gait without any abnormality or hesitancy.  Reflexes intact bilateral ankle reflexes.  Psychiatric:        Mood and Affect: Mood normal.        Behavior: Behavior normal.     ED Results / Procedures / Treatments   Labs (all labs ordered are listed, but only abnormal results are displayed) Labs Reviewed  URINALYSIS, ROUTINE W REFLEX MICROSCOPIC - Abnormal; Notable for the following components:      Result Value   Ketones, ur 5 (*)    All other components within normal limits    EKG None  Radiology No results found.  Procedures Procedures (including critical care time)  Medications Ordered in ED Medications  oxyCODONE-acetaminophen (PERCOCET/ROXICET) 5-325 MG per tablet 1 tablet (1 tablet Oral Given 05/14/19 1258)    ED Course  I have reviewed the triage vital signs and the nursing notes.  Pertinent labs & imaging results that were available during my care of the patient were reviewed by me and considered in my medical decision making (see chart for details).    MDM Rules/Calculators/A&P                      Patient is a  49 year old male with a history of protruding disc at L4/L5 presented today with back pain that is similar to his chronic back pain.  He has history of sciatic pain with right leg.  However his symptoms do not radiate past his knee but does appear to be radicular in nature.  He has no other symptoms today concerning for spinal stenosis.  He does describe some urinary hesitancy however had no difficulty prescribing a urine sample.  He has no  saddle anesthesia, urinary continence, numbness or weakness in legs.  Suspect that patient has exacerbation of his chronic symptoms today.  I did have a lengthy shared decision making of additional patient regarding lower GI MRI today.  Patient would prefer to hold off on CTs/MRI evaluation today follow-up with his orthopedist in [place].  I suspect that this is better contour for the patient in order to have continuity with his orthopedist.  Medication for emergent imaging today has patient symptoms are primarily similar to his old.  Urinalysis is unremarkable.  Patient still exam is neurologically unremarkable.  He does have palpation and reproducible or consistent with muscular strain.  Will provide patient with prednisone taper and discharge with strengthening and relaxation exercises recommendations are to prevent warm soaks and stretching.   The medical records were personally reviewed by myself. I personally reviewed all lab results and interpreted all imaging studies and either concurred with their official read or contacted radiology for clarification.   This patient appears reasonably screened and I doubt any other medical condition requiring further workup, evaluation, or treatment in the ED at this time prior to discharge.   Patient's vitals are WNL apart from vital sign abnormalities discussed above, patient is in NAD, and able to ambulate in the ED at their baseline and able to tolerate PO.  Pain has been managed or a plan has been made for home  management and has no complaints prior to discharge. Patient is comfortable with above plan and for discharge at this time. All questions were answered prior to disposition. Results from the ER workup discussed with the patient face to face and all questions answered to the best of my ability. The patient is safe for discharge with strict return precautions. Patient appears safe for discharge with appropriate follow-up. Conveyed my impression with the patient and they voiced understanding and are agreeable to plan.   An After Visit Summary was printed and given to the patient.  Portions of this note were generated with [employer]. Dictation errors may occur despite best attempts at proofreading.   Final Clinical Impression(s) / ED Diagnoses Final diagnoses:  Chronic right-sided low back pain with right-sided sciatica    Rx / DC Orders ED Discharge Orders         Ordered    predniSONE (STERAPRED UNI-PAK 21 TAB) 10 MG (21) TBPK tablet  Daily     05/14/19 1308           Pati Gallo Rudy, [place] 05/15/19 1156    Blanchie Dessert, MD 05/17/19 0710

## 2019-05-14 NOTE — Discharge Instructions (Signed)
Please do the back exercises and stretching that I have printed for you to do.  Please take Tylenol ibuprofen as I have indicated below.  Please take the prednisone taper as I have prescribed to you.  Follow-up with your primary care doctor in [place] as soon as possible.  Please use Tylenol or ibuprofen for pain.  You may use 600 mg ibuprofen every 6 hours or 1000 mg of Tylenol every 6 hours.  You may choose to alternate between the 2.  This would be most effective.  Not to exceed 4 g of Tylenol within 24 hours.  Not to exceed 3200 mg ibuprofen 24 hours.

## 2019-05-14 NOTE — ED Triage Notes (Signed)
Patient  c/o right lower back pain that radiates down the leg and toward the pelvic area. Patient states pain is worse with movement and standing.

## 2019-05-14 NOTE — ED Notes (Signed)
Wylder PA ay bedside. Signature pad not available at this time

## 2020-04-05 ENCOUNTER — Emergency Department (HOSPITAL_COMMUNITY)
Admission: EM | Admit: 2020-04-05 | Discharge: 2020-04-06 | Disposition: A | Discharge status: Home / Self Care | Attending: Emergency Medicine | Admitting: Emergency Medicine

## 2020-04-05 ENCOUNTER — Encounter (HOSPITAL_COMMUNITY): Payer: Self-pay | Admitting: Emergency Medicine

## 2020-04-05 ENCOUNTER — Other Ambulatory Visit: Payer: Self-pay

## 2020-04-05 ENCOUNTER — Emergency Department (HOSPITAL_COMMUNITY)

## 2020-04-05 DIAGNOSIS — M79602 Pain in left arm: Secondary | ICD-10-CM | POA: Diagnosis not present

## 2020-04-05 DIAGNOSIS — F17210 Nicotine dependence, cigarettes, uncomplicated: Secondary | ICD-10-CM | POA: Insufficient documentation

## 2020-04-05 DIAGNOSIS — R1013 Epigastric pain: Secondary | ICD-10-CM | POA: Diagnosis not present

## 2020-04-05 DIAGNOSIS — R110 Nausea: Secondary | ICD-10-CM | POA: Insufficient documentation

## 2020-04-05 LAB — HEPATIC FUNCTION PANEL
ALT: 33 U/L (ref 0–44)
AST: 20 U/L (ref 15–41)
Albumin: 4.7 g/dL (ref 3.5–5.0)
Alkaline Phosphatase: 51 U/L (ref 38–126)
Bilirubin, Direct: 0.1 mg/dL (ref 0.0–0.2)
Indirect Bilirubin: 0.6 mg/dL (ref 0.3–0.9)
Total Bilirubin: 0.7 mg/dL (ref 0.3–1.2)
Total Protein: 7.3 g/dL (ref 6.5–8.1)

## 2020-04-05 LAB — TROPONIN I (HIGH SENSITIVITY): Troponin I (High Sensitivity): 2 ng/L (ref <18)

## 2020-04-05 LAB — BASIC METABOLIC PANEL
Anion gap: 11 (ref 5–15)
BUN: 17 mg/dL (ref 6–20)
CO2: 22 mmol/L (ref 22–32)
Calcium: 9.6 mg/dL (ref 8.9–10.3)
Chloride: 106 mmol/L (ref 98–111)
Creatinine, Ser: 1.29 mg/dL — ABNORMAL HIGH (ref 0.61–1.24)
GFR, Estimated: >60 mL/min (ref >60)
Glucose, Bld: 91 mg/dL (ref 70–99)
Potassium: 4.3 mmol/L (ref 3.5–5.1)
Sodium: 139 mmol/L (ref 135–145)

## 2020-04-05 LAB — CBC
HCT: 46.6 % (ref 39.0–52.0)
Hemoglobin: 16.2 g/dL (ref 13.0–17.0)
MCH: 31.4 pg (ref 26.0–34.0)
MCHC: 34.8 g/dL (ref 30.0–36.0)
MCV: 90.3 fL (ref 80.0–100.0)
Platelets: 312 10*3/uL (ref 150–400)
RBC: 5.16 MIL/uL (ref 4.22–5.81)
RDW: 12.5 % (ref 11.5–15.5)
WBC: 10.8 10*3/uL — ABNORMAL HIGH (ref 4.0–10.5)
nRBC: 0 % (ref 0.0–0.2)

## 2020-04-05 LAB — LIPASE, BLOOD: Lipase: 41 U/L (ref 11–51)

## 2020-04-05 MED ORDER — ALUM & MAG HYDROXIDE-SIMETH 200-200-20 MG/5ML PO SUSP
30.0000 mL | Freq: Once | ORAL | Status: AC
  Administered 2020-04-05: 30 mL via ORAL
  Filled 2020-04-05: qty 30

## 2020-04-05 MED ORDER — LIDOCAINE VISCOUS HCL 2 % MT SOLN
15.0000 mL | Freq: Once | OROMUCOSAL | Status: AC
  Administered 2020-04-05: 15 mL via ORAL
  Filled 2020-04-05: qty 15

## 2020-04-05 MED ORDER — ACETAMINOPHEN 325 MG PO TABS
650.0000 mg | ORAL_TABLET | Freq: Once | ORAL | Status: AC
  Administered 2020-04-05: 650 mg via ORAL
  Filled 2020-04-05: qty 2

## 2020-04-05 NOTE — ED Provider Notes (Signed)
Patient is a 49 year old male whose care was transferred to me by Leim Fabry.  Her HPI is below:  Ralph Rivera is a 49 y.o. male w PMHx appendectomy, cholecystectomy, presenting with epigastric abdominal pain/lower chest pain that began 4 days ago. Pain is constant and throbbing with intermittent sharp pains. He is having some intermittent assoc nausea, sometimes has some pain in his left arm. His symptoms are worse after meals.  Symptoms are not exertional.  He thinks maybe he has an ulcer. He attributes this to anxiety. He has been treating symptoms with ibuprofen and naproxen.  No fevers, cough, vomiting, diarrhea or constipation.  No alcohol use.  No cardiac history, no history of hypertension, hyperlipidemia, diabetes.  He does smoke cigarettes.  No first-degree relatives with heart disease before the age of 31.  No history of DVT or PE, no hemoptysis, unilateral leg pain or swelling, recent trauma/surgery/immobilization, no history of cancer.  The history is provided by the patient.  Physical Exam  BP 127/74   Pulse 70   Temp 98.7 F (37.1 C) (Oral)   Resp 15   Ht 5\' 10"  (1.778 m)   Wt 83.9 kg   SpO2 98%   BMI 26.54 kg/m   Physical Exam Vitals and nursing note reviewed.  Constitutional:      General: He is not in acute distress.    Appearance: He is well-developed and well-nourished. He is not ill-appearing.  HENT:     Head: Normocephalic and atraumatic.  Eyes:     Conjunctiva/sclera: Conjunctivae normal.  Cardiovascular:     Rate and Rhythm: Normal rate and regular rhythm.     Pulses: Normal pulses.  Pulmonary:     Effort: Pulmonary effort is normal. No respiratory distress.     Breath sounds: Normal breath sounds.  Chest:     Chest wall: Tenderness present.  Abdominal:     General: Bowel sounds are normal.     Palpations: Abdomen is soft.     Tenderness: There is abdominal tenderness in the epigastric area. There is no guarding or rebound.  Musculoskeletal:      Right lower leg: No edema.     Left lower leg: No edema.  Skin:    General: Skin is warm.  Neurological:     Mental Status: He is alert.  Psychiatric:        Mood and Affect: Mood and affect normal.        Behavior: Behavior normal.  ED Course/Procedures     Procedures  MDM   Patient is a 49 year old male whose care was transferred to me at shift change.  Please see note below for additional information regarding the patient as well as his visit today.  Labs today are reassuring.  Very mild leukocytosis on CBC at 10.8.  Electrolytes within normal limits on BMP.  Slightly elevated creatinine at 1.29 with a GFR greater than 60.  Initial troponin at 2 with a delta of 2.  No elevation in lipase.  Hepatic function panel within normal limits.  Patient notes significant improvement after GI cocktail as well as Tylenol.  Feel the patient is safe for discharge at this time.  We will start him on omeprazole.  PCP follow-up.  We will give a referral to GI.  Return to the ER with new or worsening symptoms.  D/C NSAIDs. His questions were answered and he was amicable at the time of discharge.     54, PA-C 04/06/20 [phone]  Mesner, Barbara Cower, MD 04/06/20 [phone]

## 2020-04-05 NOTE — ED Triage Notes (Signed)
Patient reports constant chest pain and nausea x3 days. Describes the pain as throbbing. Reports pain worsens when taking a deep breath. Also reports some radiation in L arm. Denies any medical hx

## 2020-04-05 NOTE — Discharge Instructions (Addendum)
Please read instructions below. Drink clear liquids until your stomach feels better. Then, slowly introduce bland foods into your diet as tolerated.  Avoid spicy, greasy, acidic foods as this can worsen your symptoms.  Avoid NSAID medications such as Advil/ibuprofen/Motrin, Aleve, aspirin, Goody's powder, BC powder.  Remain is sitting upright for at least 45 minutes after meals. Take the omeprazole daily. You can get this over-the-counter after your prescription runs out. Follow up with your primary care provider. Return to the ER for severely worsening abdominal pain, worsening chest pain, shortness of breath, fever, uncontrollable vomiting, or vomiting blood.

## 2020-04-05 NOTE — ED Provider Notes (Signed)
[facility]-EMERGENCY DEPT Provider Note   CSN: [id number] Arrival date & time: 04/05/20  1810     History Chief Complaint  Patient presents with  . Chest Pain    Ralph Rivera is a 49 y.o. male w PMHx appendectomy, cholecystectomy, presenting with epigastric abdominal pain/lower chest pain that began 4 days ago. Pain is constant and throbbing with intermittent sharp pains. He is having some intermittent assoc nausea, sometimes has some pain in his left arm. His symptoms are worse after meals.  Symptoms are not exertional.  He thinks maybe he has an ulcer. He attributes this to anxiety. He has been treating symptoms with ibuprofen and naproxen.  No fevers, cough, vomiting, diarrhea or constipation.  No alcohol use.  No cardiac history, no history of hypertension, hyperlipidemia, diabetes.  He does smoke cigarettes.  No first-degree relatives with heart disease before the age of 44.  No history of DVT or PE, no hemoptysis, unilateral leg pain or swelling, recent trauma/surgery/immobilization, no history of cancer.  The history is provided by the patient.       History reviewed. No pertinent past medical history.  There are no problems to display for this patient.   Past Surgical History:  Procedure Laterality Date  . APPENDECTOMY    . CHOLECYSTECTOMY         Family History  Problem Relation Age of Onset  . Healthy Mother   . Healthy Father     Social History   Tobacco Use  . Smoking status: Current Every Day Smoker    Packs/day: 0.50    Types: Cigarettes  . Smokeless tobacco: Never Used  Vaping Use  . Vaping Use: Never used  Substance Use Topics  . Alcohol use: No  . Drug use: No    Home Medications Prior to Admission medications   Medication Sig Start Date End Date Taking? Authorizing Provider  ARIPiprazole (ABILIFY) 5 MG tablet Take 5 mg by mouth daily. 03/25/20  Yes [provider]  buPROPion (WELLBUTRIN XL) 300 MG 24 hr  tablet Take 300 mg by mouth daily. 12/30/19  Yes [provider]  FLUoxetine (PROZAC) 40 MG capsule Take 40 mg by mouth daily. 01/29/20  Yes [provider]  ibuprofen (ADVIL,MOTRIN) 800 MG tablet Take 800 mg by mouth every 6 (six) hours as needed for mild pain.   Yes [provider]  naproxen (NAPROSYN) 500 MG tablet Take 500 mg by mouth daily as needed for moderate pain. 12/24/19  Yes [provider]  doxycycline (VIBRAMYCIN) 100 MG capsule Take 1 capsule (100 mg total) by mouth 2 (two) times daily. One po bid x 7 days Patient not taking: No sig reported 12/16/16   Fayrene Helper, PA-C  predniSONE (STERAPRED UNI-PAK 21 TAB) 10 MG (21) TBPK tablet Take by mouth daily. Take 6 tabs by mouth daily  for 2 days, then 5 tabs for 2 days, then 4 tabs for 2 days, then 3 tabs for 2 days, 2 tabs for 2 days, then 1 tab by mouth daily for 2 days Patient not taking: No sig reported 05/14/19   Gailen Shelter, PA    Allergies    Penicillins  Review of Systems   Review of Systems  All other systems reviewed and are negative.   Physical Exam Updated Vital Signs BP 127/74   Pulse 70   Temp 98.7 F (37.1 C) (Oral)   Resp 15   Ht 5\' 10"  (1.778 m)   Wt 83.9 kg  SpO2 98%   BMI 26.54 kg/m   Physical Exam Vitals and nursing note reviewed.  Constitutional:      General: He is not in acute distress.    Appearance: He is well-developed and well-nourished. He is not ill-appearing.  HENT:     Head: Normocephalic and atraumatic.  Eyes:     Conjunctiva/sclera: Conjunctivae normal.  Cardiovascular:     Rate and Rhythm: Normal rate and regular rhythm.     Pulses: Normal pulses.  Pulmonary:     Effort: Pulmonary effort is normal. No respiratory distress.     Breath sounds: Normal breath sounds.  Chest:     Chest wall: Tenderness present.  Abdominal:     General: Bowel sounds are normal.     Palpations: Abdomen is soft.     Tenderness: There is abdominal tenderness in  the epigastric area. There is no guarding or rebound.  Musculoskeletal:     Right lower leg: No edema.     Left lower leg: No edema.  Skin:    General: Skin is warm.  Neurological:     Mental Status: He is alert.  Psychiatric:        Mood and Affect: Mood and affect normal.        Behavior: Behavior normal.     ED Results / Procedures / Treatments   Labs (all labs ordered are listed, but only abnormal results are displayed) Labs Reviewed  BASIC METABOLIC PANEL - Abnormal; Notable for the following components:      Result Value   Creatinine, Ser 1.29 (*)    All other components within normal limits  CBC - Abnormal; Notable for the following components:   WBC 10.8 (*)    All other components within normal limits  HEPATIC FUNCTION PANEL  LIPASE, BLOOD  TROPONIN I (HIGH SENSITIVITY)  TROPONIN I (HIGH SENSITIVITY)    EKG EKG Interpretation  Date/Time:  Sunday April 05 2020 19:01:11 EST Ventricular Rate:  77 PR Interval:    QRS Duration: 87 QT Interval:  358 QTC Calculation: 406 R Axis:   87 Text Interpretation: Sinus rhythm 12 Lead; Mason-Likar No significant change since last tracing Confirmed by Alvira Monday ([id number]) on 04/05/2020 11:10:32 PM   Radiology DG Abdomen Acute W/Chest  Result Date: 04/05/2020 CLINICAL DATA:  Epigastric pain EXAM: DG ABDOMEN ACUTE WITH 1 VIEW CHEST COMPARISON:  None. FINDINGS: The lung fields are clear. There is no pneumothorax or large pleural effusion. No focal infiltrate. The cardiac silhouette is unremarkable. The trachea is midline. There is no acute osseous abnormality. The visualized bowel gas pattern is unremarkable. There are no radiopaque kidney stones. The stool burden is average. There are multiple calcifications projecting over the patient's pelvis that are statistically most likely to represent phleboliths. IMPRESSION: 1. No acute cardiopulmonary disease. 2. Nonobstructive bowel gas pattern. 3. No radiopaque kidney stones.  Electronically Signed   By: Katherine Mantle M.D.   On: 04/05/2020 22:11    Procedures Procedures (including critical care time)  Medications Ordered in ED Medications  alum & mag hydroxide-simeth (MAALOX/MYLANTA) 200-200-20 MG/5ML suspension 30 mL (30 mLs Oral Given 04/05/20 2222)    And  lidocaine (XYLOCAINE) 2 % viscous mouth solution 15 mL (15 mLs Oral Given 04/05/20 2222)  acetaminophen (TYLENOL) tablet 650 mg (650 mg Oral Given 04/05/20 2215)    ED Course  I have reviewed the triage vital signs and the nursing notes.  Pertinent labs & imaging results that were available during my care of  the patient were reviewed by me and considered in my medical decision making (see chart for details).    MDM Rules/Calculators/A&P                          Patient presenting for 4 days of constant epigastric/lower chest pain that is described as throbbing in nature with intermittent sharp pains.  He is having some nausea, symptoms are worse postprandial, not exertional.  He also reports some intermittent radiation to the left arm.  He has been treating with ibuprofen and naproxen.  He does not drink alcohol.  No cardiac history.  No respiratory symptoms, low risk Wells, PERC negative.  On examination, he is well-appearing and in no distress.  Pain is reproducible with palpation of the epigastric region of the abdomen.  No guarding or rebound.  Vital signs are stable.  EKG is nonischemic and unchanged from previous.  Laboratory work-up ordered in triage includes CBC, BMP, troponin.  Will add on hepatic function panel and lipase.  Acute abdomen with chest x-ray also ordered.  GI cocktail and Tylenol ordered for symptom management.   Low heart score of 2.  Low risk Wells/PERC negative.  Initial troponin is negative.  Metabolic panel is unremarkable.  CBC with very slight leukocytosis of 10.8.  Patient reports quite a bit of improvement after GI cocktail and Tylenol.    Care assumed at shift change by  [facility].  Plan to follow LFTs and delta troponin.  If negative, patient is appropriate for discharge with outpatient follow-up.  Recommend PPI with concern for possible gastritis versus PUD.  Recommend discontinue NSAIDs, additional diet modification.   Final Clinical Impression(s) / ED Diagnoses Final diagnoses:  None    Rx / DC Orders ED Discharge Orders    None       Harbor Paster, [place] N, PA-C 04/05/20 2359    Alvira Monday, MD 04/06/20 [phone]

## 2020-04-06 LAB — TROPONIN I (HIGH SENSITIVITY): Troponin I (High Sensitivity): 2 ng/L (ref <18)

## 2020-04-06 MED ORDER — OMEPRAZOLE 20 MG PO CPDR: 20.0000 mg | DELAYED_RELEASE_CAPSULE | Freq: Every day | ORAL | 0 refills | Status: AC

## 2020-09-16 ENCOUNTER — Emergency Department: Admit: 2020-09-16 | Payer: PRIVATE HEALTH INSURANCE | Primary: Sports Medicine

## 2020-09-16 ENCOUNTER — Inpatient Hospital Stay
Admit: 2020-09-16 | Discharge: 2020-09-16 | Disposition: A | Discharge status: Home / Self Care | Payer: PRIVATE HEALTH INSURANCE | Attending: Emergency Medicine

## 2020-09-16 ENCOUNTER — Inpatient Hospital Stay: Admit: 2020-09-16 | Payer: PRIVATE HEALTH INSURANCE | Primary: Sports Medicine

## 2020-09-16 DIAGNOSIS — R1084 Generalized abdominal pain: Secondary | ICD-10-CM

## 2020-09-16 LAB — EKG 12-LEAD
Atrial Rate: 74 {beats}/min
Diagnosis: NORMAL
P Axis: 73 degrees
P-R Interval: 144 ms
Q-T Interval: 390 ms
QRS Duration: 90 ms
QTc Calculation (Bazett): 432 ms
R Axis: 104 degrees
T Axis: 34 degrees
Ventricular Rate: 74 {beats}/min

## 2020-09-16 LAB — POCT URINALYSIS DIPSTICK
Bilirubin, Urine, POC: NEGATIVE
Blood, UA POC: NEGATIVE
Glucose, UA POC: NEGATIVE mg/dL
Ketones, Urine, POC: NEGATIVE mg/dL
Leukocyte Est, UA POC: NEGATIVE
Nitrite, Urine, POC: NEGATIVE
Protein, Urine, POC: NEGATIVE mg/dL
Specific Gravity, Urine, POC: 1.025 — ABNORMAL HIGH (ref 1.001–1.023)
URINE UROBILINOGEN POC: 0.2 EU/dL (ref 0.2–1.0)
pH, Urine, POC: 5.5 (ref 5.0–9.0)

## 2020-09-16 LAB — COMPREHENSIVE METABOLIC PANEL
ALT: 55 U/L (ref 12–65)
AST: 23 U/L (ref 15–37)
Albumin/Globulin Ratio: 1.4 (ref 1.2–3.5)
Albumin: 4.6 g/dL (ref 3.5–5.0)
Alk Phosphatase: 67 U/L (ref 50–136)
Anion Gap: 5 mmol/L — ABNORMAL LOW (ref 7–16)
BUN: 13 MG/DL (ref 6–23)
CO2: 28 mmol/L (ref 21–32)
Calcium: 9.2 MG/DL (ref 8.3–10.4)
Chloride: 105 mmol/L (ref 98–107)
Creatinine: 1.3 MG/DL (ref 0.8–1.5)
GFR African American: >60 mL/min/{1.73_m2} (ref >60)
GFR Non-African American: >60 mL/min/{1.73_m2} (ref >60)
Globulin: 3.3 g/dL (ref 2.3–3.5)
Glucose: 91 mg/dL (ref 65–100)
Potassium: 4.8 mmol/L (ref 3.5–5.1)
Sodium: 138 mmol/L (ref 138–145)
Total Bilirubin: 1 MG/DL (ref 0.2–1.1)
Total Protein: 7.9 g/dL (ref 6.3–8.2)

## 2020-09-16 LAB — TROPONIN: Troponin, High Sensitivity: 3.5 pg/mL (ref 0–14)

## 2020-09-16 LAB — CBC
Hematocrit: 49.4 % (ref 41.1–50.3)
Hemoglobin: 16.5 g/dL (ref 13.6–17.2)
MCH: 29.8 PG (ref 26.1–32.9)
MCHC: 33.4 g/dL (ref 31.4–35.0)
MCV: 89.2 FL (ref 79.6–97.8)
MPV: 10.2 FL (ref 9.4–12.3)
Platelets: 349 10*3/uL (ref 150–450)
RBC: 5.54 M/uL (ref 4.23–5.6)
RDW: 12.2 % (ref 11.9–14.6)
WBC: 10 10*3/uL (ref 4.3–11.1)
nRBC: 0 10*3/uL (ref 0.0–0.2)

## 2020-09-16 LAB — LIPASE: Lipase: 155 U/L (ref 73–393)

## 2020-09-16 LAB — MAGNESIUM: Magnesium: 2.3 mg/dL (ref 1.8–2.4)

## 2020-09-16 MED ORDER — DIATRIZOATE MEGLUMINE & SODIUM 66-10 % PO SOLN
66-10 % | ORAL | Status: DC
Start: 2020-09-16 — End: 2020-09-16

## 2020-09-16 MED ORDER — PANTOPRAZOLE SODIUM 40 MG PO TBEC
40 MG | ORAL_TABLET | Freq: Every day | ORAL | 0 refills | Status: AC
Start: 2020-09-16 — End: 2020-10-16

## 2020-09-16 NOTE — ED Provider Notes (Signed)
[facility] Emergency Department Provider Note                   PCP:                None Provider               Age: 50 y.o.      Sex: male       ICD-10-CM    1. Generalized abdominal pain  R10.84    2. Umbilical hernia without obstruction and without gangrene  K42.9        DISPOSITION         New Prescriptions    No medications on file       Orders Placed This Encounter   Procedures   ??? XR CHEST (2 VW)   ??? CT ABDOMEN PELVIS RENAL STONE Additional Contrast? None   ??? CBC   ??? Comprehensive Metabolic Panel   ??? Lipase   ??? Magnesium   ??? Cardiac Monitor   ??? Pulse Oximetry   ??? POCT Urine Dipstick   ??? POCT Urinalysis no Micro   ??? EKG 12 Lead        MDM  Number of Diagnoses or Management Options  Generalized abdominal pain: new, needed workup  Umbilical hernia without obstruction and without gangrene: new, needed workup  Diagnosis management comments: Vital signs stable.  Patient well-appearing.  Given patient with urinary frequency and difficulty and suprapubic pain will obtain CT renal stone protocol.    UA w/ blood or findings suggestive of UTI.  H&H 16.5/49.4.  Rectal exam with brown stool.  Guaiac negative.  ====================================  EKG with normal sinus rhythm.  No ischemic changes noted.  Troponin unremarkable.  Remainder of labs unremarkable.  Patient denies any chest pain or discomfort at this time.  Denies any major abdominal discomfort at this time.  Clines any pain control.  CT with small umbilical fat-containing hernia.  No evidence of obstruction or gangrene. CXR clear.  Will discharge home with instructions to follow-up with PCP, GI.  Patient given return precautions.       Amount and/or Complexity of Data Reviewed  Clinical lab tests: ordered and reviewed  Tests in the radiology section of CPT??: ordered and reviewed  Tests in the medicine section of CPT??: ordered and reviewed  Review and summarize past medical records: yes  Independent visualization of images, tracings, or specimens: yes    Risk  of Complications, Morbidity, and/or Mortality  Presenting problems: moderate  Diagnostic procedures: moderate  Management options: moderate  General comments: Results Include:    Recent Results (from the past 24 hour(s))  -EKG 12 Lead:   Collection Time: 09/16/20  1:18 PM       Result                      Value             Ref Range           Ventricular Rate            74                BPM                 Atrial Rate                 74                BPM  P-R Interval                144               ms                  QRS Duration                90                ms                  Q-T Interval                390               ms                  QTc Calculation (Bazet*     432               ms                  P Axis                      73                degrees             R Axis                      104               degrees             T Axis                      34                degrees             Diagnosis                                                     Normal sinus rhythm  -Troponin:   Collection Time: 09/16/20  1:22 PM       Result                      Value             Ref Range           Troponin, High Sensiti*     3.5               0 - 14 pg/mL   -CBC:   Collection Time: 09/16/20  1:22 PM       Result                      Value             Ref Range           WBC                         10.0              4.3 - 11.1 K*  RBC                         5.54              4.23 - 5.6 M*       Hemoglobin                  16.5              13.6 - 17.2 *       Hematocrit                  49.4              41.1 - 50.3 %       MCV                         89.2              79.6 - 97.8 *       MCH                         29.8              26.1 - 32.9 *       MCHC                        33.4              31.4 - 35.0 *       RDW                         12.2              11.9 - 14.6 %       Platelets                   349               150 - 450 K/*       MPV                         10.2               9.4 - 12.3 FL       nRBC                        0.00              0.0 - 0.2 K/*  -Comprehensive Metabolic Panel:   Collection Time: 09/16/20  1:22 PM       Result                      Value             Ref Range           Sodium                      138               138 - 145 mm*       Potassium                   4.8  3.5 - 5.1 mm*       Chloride                    105               98 - 107 mmo*       CO2                         28                21 - 32 mmol*       Anion Gap                   5 (L)             7 - 16 mmol/L       Glucose                     91                65 - 100 mg/*       BUN                         13                6 - 23 MG/DL        CREATININE                  1.30              0.8 - 1.5 MG*       GFR African American        >60               >60 ml/min/1*       GFR Non-African Americ*     >60               >60 ml/min/1*       Calcium                     9.2               8.3 - 10.4 M*       Total Bilirubin             1.0               0.2 - 1.1 MG*       ALT                         55                12 - 65 U/L         AST                         23                15 - 37 U/L         Alk Phosphatase             67                50 - 136 U/L        Total Protein  7.9               6.3 - 8.2 g/*       Albumin                     4.6               3.5 - 5.0 g/*       Globulin                    3.3               2.3 - 3.5 g/*       Albumin/Globulin Ratio      1.4               1.2 - 3.5      -Lipase:   Collection Time: 09/16/20  1:22 PM       Result                      Value             Ref Range           Lipase                      155               73 - 393 U/L   -Magnesium:   Collection Time: 09/16/20  1:22 PM       Result                      Value             Ref Range           Magnesium                   2.3               1.8 - 2.4 mg*  -POCT Urinalysis no Micro:   Collection Time: 09/16/20  3:16 PM       Result                      Value              Ref Range           Specific Gravity, Urin*     1.025 (H)         1.001 - 1.02*       pH, Urine, POC              5.5               5.0 - 9.0           Protein, Urine, POC         Negative          NEG mg/dL           Glucose, UA POC             Negative          NEG mg/dL           KETONES, Urine, POC         Negative          NEG mg/dL           Bilirubin, Urine, POC  Negative          NEG                 Blood, UA POC               Negative          NEG                 URINE UROBILINOGEN POC      0.2               0.2 - 1.0 EU*       Nitrate, Urine, POC         Negative          NEG                 Leukocyte Est, UA POC       Negative          NEG                 Performed by:                                                 Honor Junes      Patient Progress  Patient progress: stable       Jamie Acevedo is a 50 y.o. male who presents to the Emergency Department with chief complaint of suprapubic pain.       Chief Complaint   Patient presents with   ??? Rectal Bleeding   ??? Chest Pain      50 year old male with history of IBS presents with complaint of suprapubic abdominal discomfort that is progressively worsened since Saturday.  States that he has had difficulty with urinating.  States that it takes him several minutes to get to be able to urinate.  Patient states that he noted bright red blood upon wiping this weekend.  Describes pain as constant, cramping.  Denies history of kidney stone.  Denies history of prostatitis or BPH.  Reports previous history of colonoscopy that was reportedly unremarkable.  Denies hematuria, dysuria, dizziness, weakness, fever, chills, diarrhea.  Denies being on any anticoagulants.  Denies testicular pain or swelling.  Denies penile discharge.  Unlike triage documentation, patient denies any chest pain or shortness of breath to me at this time.  Patient states that his chief complaint is abdominal cramping.    The history is provided by the patient. No language  interpreter was used.       Review of Systems   Constitutional: Negative for chills, diaphoresis, fatigue and fever.   HENT: Negative for congestion and rhinorrhea.    Respiratory: Negative for cough, choking and shortness of breath.    Cardiovascular: Negative for chest pain.   Gastrointestinal: Positive for abdominal pain. Negative for anal bleeding, blood in stool, constipation, diarrhea, nausea and vomiting.   Genitourinary: Positive for difficulty urinating. Negative for dysuria, flank pain, genital sores, hematuria, penile discharge, penile swelling, scrotal swelling and testicular pain.   Musculoskeletal: Negative for arthralgias, back pain and myalgias.   Skin: Negative for rash and wound.   Neurological: Negative for dizziness, syncope, weakness, light-headedness and headaches.   Hematological: Does not bruise/bleed easily.       No past medical history on file.     No past surgical history on file.  No family history on file.        Social Connections:    ??? Frequency of Communication with Friends and Family: Not on file   ??? Frequency of Social Gatherings with Friends and Family: Not on file   ??? Attends Religious Services: Not on file   ??? Active Member of Clubs or Organizations: Not on file   ??? Attends Archivist Meetings: Not on file   ??? Marital Status: Not on file        Allergies   Allergen Reactions   ??? Penicillins Other (See Comments)     Unknown reaction, "as a baby I could not take it"        Vitals signs and nursing note reviewed.   Patient Vitals for the past 4 hrs:   Temp Pulse Resp BP SpO2   09/16/20 1319 98.9 ??F (37.2 ??C) 65 18 (!) 149/96 100 %          Physical Exam  Vitals and nursing note reviewed. Exam conducted with a chaperone present.   Constitutional:       Appearance: Normal appearance. He is not ill-appearing.   HENT:      Head: Normocephalic.      Nose: Nose normal.      Mouth/Throat:      Mouth: Mucous membranes are moist.   Eyes:      Extraocular Movements: Extraocular  movements intact.      Pupils: Pupils are equal, round, and reactive to light.   Cardiovascular:      Rate and Rhythm: Normal rate.      Pulses: Normal pulses.   Pulmonary:      Effort: Pulmonary effort is normal.      Breath sounds: Normal breath sounds.   Abdominal:      General: Bowel sounds are normal. There is no distension.      Palpations: Abdomen is soft.      Tenderness: There is no right CVA tenderness, left CVA tenderness, guarding or rebound.      Comments: Mild suprapubic tenderness to palpation.  No rebound or guarding.  No CVA tenderness.  No peritoneal signs.  No pulsatile mass noted.  No hernia appreciated.   Genitourinary:     Comments: Rectal exam with no fissures or hemorrhoids noted.  Light brown stool.  Guaiac negative.  Musculoskeletal:         General: Swelling present. Normal range of motion.      Cervical back: Normal range of motion.   Skin:     General: Skin is warm.      Capillary Refill: Capillary refill takes less than 2 seconds.      Findings: No erythema or rash.   Neurological:      General: No focal deficit present.      Mental Status: He is alert and oriented to person, place, and time.      Cranial Nerves: No cranial nerve deficit.      Sensory: No sensory deficit.      Motor: No weakness.          EKG 12 Lead    Date/Time: 09/16/2020 2:27 PM  Performed by: Saunders Revel Shirlean Schlein., MD  Authorized by: Honor Loh., MD     ECG reviewed by ED Physician in the absence of a cardiologist: yes    Rate:     ECG rate:  74    ECG rate assessment: normal    Rhythm:  Rhythm: sinus rhythm    Ectopy:     Ectopy: none    QRS:     QRS axis:  Right    QRS intervals:  Normal  Conduction:     Conduction: normal    ST segments:     ST segments:  Normal  T waves:     T waves: normal          Labs Reviewed   COMPREHENSIVE METABOLIC PANEL - Abnormal; Notable for the following components:       Result Value    Anion Gap 5 (*)     All other components within normal limits   POCT URINALYSIS  DIPSTICK - Abnormal; Notable for the following components:    Specific Gravity, Urine, POC 1.025 (*)     All other components within normal limits   TROPONIN   CBC   LIPASE   MAGNESIUM        CT ABDOMEN PELVIS RENAL STONE Additional Contrast? None   Final Result   1. No evidence of urinary tract calculi or hydronephrosis.   2. Small umbilical hernia containing fat.               XR CHEST (2 VW)   Final Result   No active disease in the chest.                                  ED Course as of 09/16/20 1541   Wed Sep 16, 2020   1534 CT renal stone protocol CT ABDOMEN: The lack of oral and IV contrast results in an incomplete assessment  of the solid and hollow abdominal viscera. There is exclusion of the superior  portions of the abdomen including portions of the liver. The visualized portions  of the liver are unremarkable on this noncontrast study study. The pancreas and  adrenal glands are unremarkable on this noncontrast study.   ??  Several splenic calcifications are present compatible with prior granulomatous  disease. There is a small umbilical hernia containing fat. There are  postoperative changes within the right lower quadrant.  ??  No renal calculi are present. There is no hydronephrosis. No adenopathy or  ascites is present. There are no inflammatory changes.  ??  CT PELVIS: No calculi are identified along the course of the distal ureters.  Several pelvic calcifications are most compatible with phleboliths. No  adenopathy or ascites is present. There are no inflammatory changes.  ??  IMPRESSION:  1. No evidence of urinary tract calculi or hydronephrosis.  2. Small umbilical hernia containing fat.  ?? [DF]   5784 CXR Findings: The heart is normal in size and configuration. The lungs and pleural  spaces are clear. The pulmonary vascularity is within normal limits. The  visualized osseous structures are unremarkable.  ??  IMPRESSION:  No active disease in the chest. [DF]      ED Course User Index  [DF] Jamie Acevedo  Shirlean Schlein., MD        Voice dictation software was used during the making of this note.  This software is not perfect and grammatical and other typographical errors may be present.  This note has not been completely proofread for errors.        Jamie Oros Acevedo Shirlean Schlein., MD  09/16/20 [phone]

## 2020-09-16 NOTE — Discharge Instructions (Signed)
Schedule close follow-up primary care physician, GI.    Return to ED if symptoms worsen or progress in any way.

## 2020-09-16 NOTE — ED Triage Notes (Addendum)
Pt ambulatory to triage. Pt reports lower abdominal pain and blood in the stool since Saturday. Pt states the blood in stool started as bright red and now is dark, pt feels weak recently. Pt's abdominal pain is constant, cramping, no aggravating factors that pt is aware of, has tried ibuprofen with some relief. Pt also reports he has left sided chest pain, described as pressure and goes up into his neck and down his left arm. Pt states he does notice that it has been taking him longer to start his urine stream and this has been getting worse over the past few months, denies dysuria or hematuria. Pt denies nausea/vomiting, fever/chills, shob, body aches. Pt reports he has IBS but does not take medications for that, denies cardiac hx.

## 2020-09-16 NOTE — ED Triage Notes (Signed)
I have reviewed discharge instructions with the patient.  The patient verbalized understanding.    Patient left ED via Discharge Method: ambulatory to Home with self  Opportunity for questions and clarification provided.       Patient given 1 scripts.         To continue your aftercare when you leave the hospital, you may receive an automated call from our care team to check in on how you are doing.  This is a free service and part of our promise to provide the best care and service to meet your aftercare needs.??? If you have questions, or wish to unsubscribe from this service please call [phone].  Thank you for Choosing our [facility] Emergency Department. '

## 2021-09-18 IMAGING — CR DG ABDOMEN ACUTE W/ 1V CHEST
4 series · 4 of 4 positions shown · non-contrast
Comparison: None.

CLINICAL DATA: Epigastric pain

EXAM:
DG ABDOMEN ACUTE WITH 1 VIEW CHEST

[w chest pa]
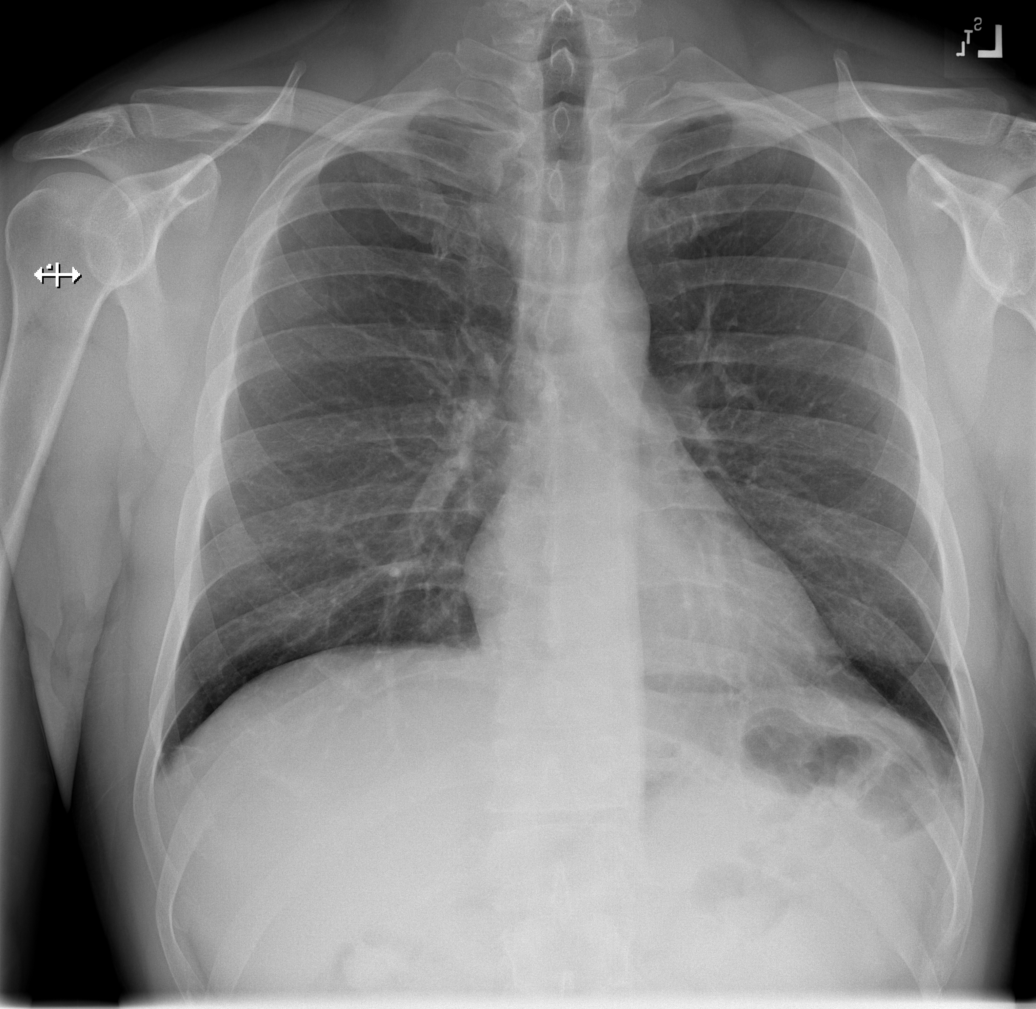

[w abdomen upright]
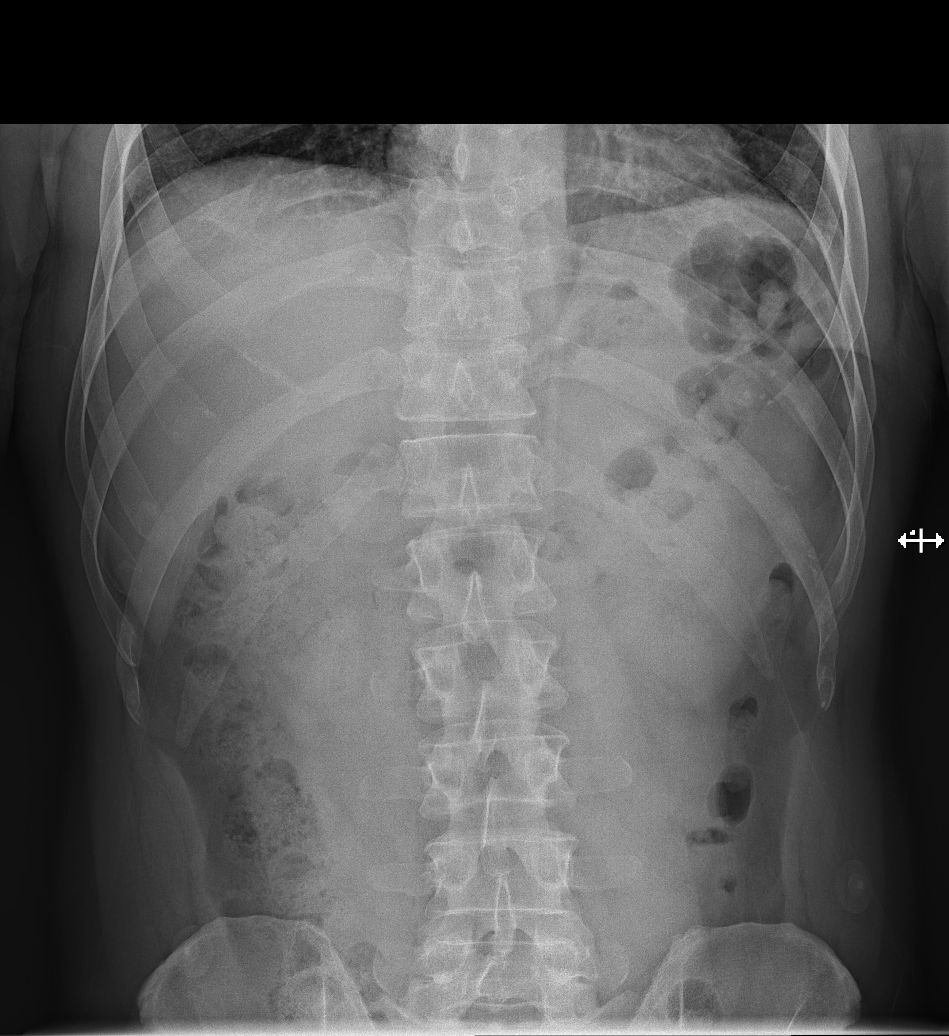

[t abdomen supine (1 of 2)]
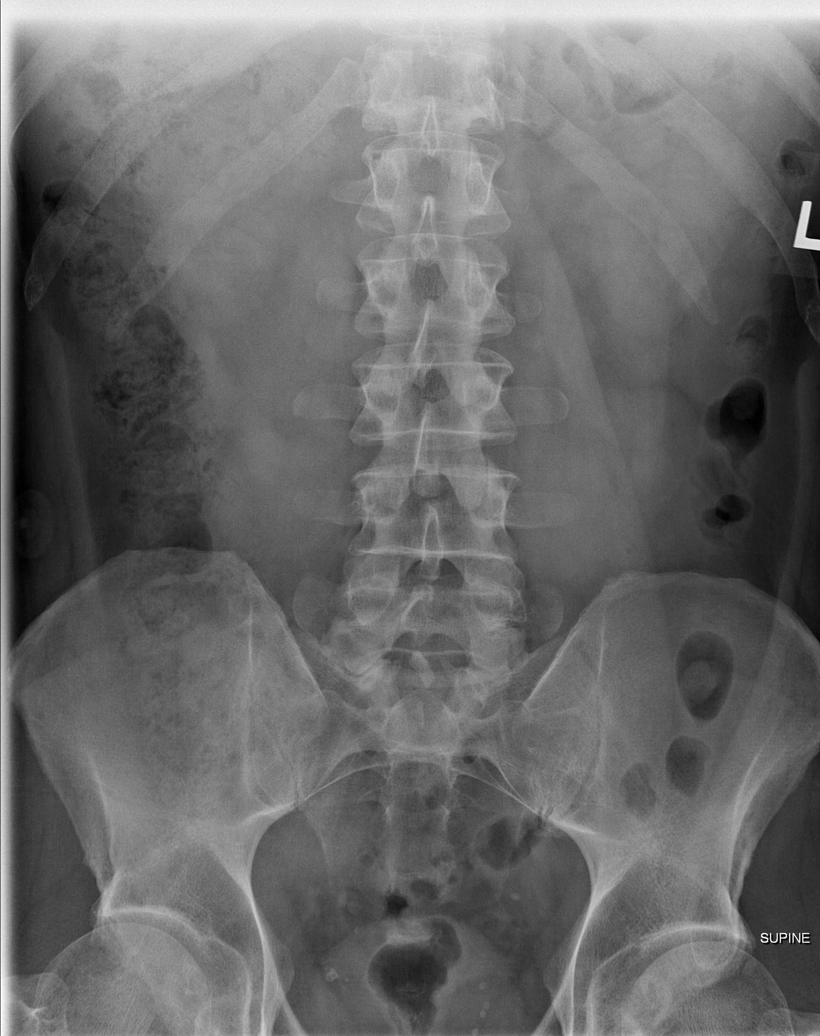

[t abdomen supine (2 of 2)]
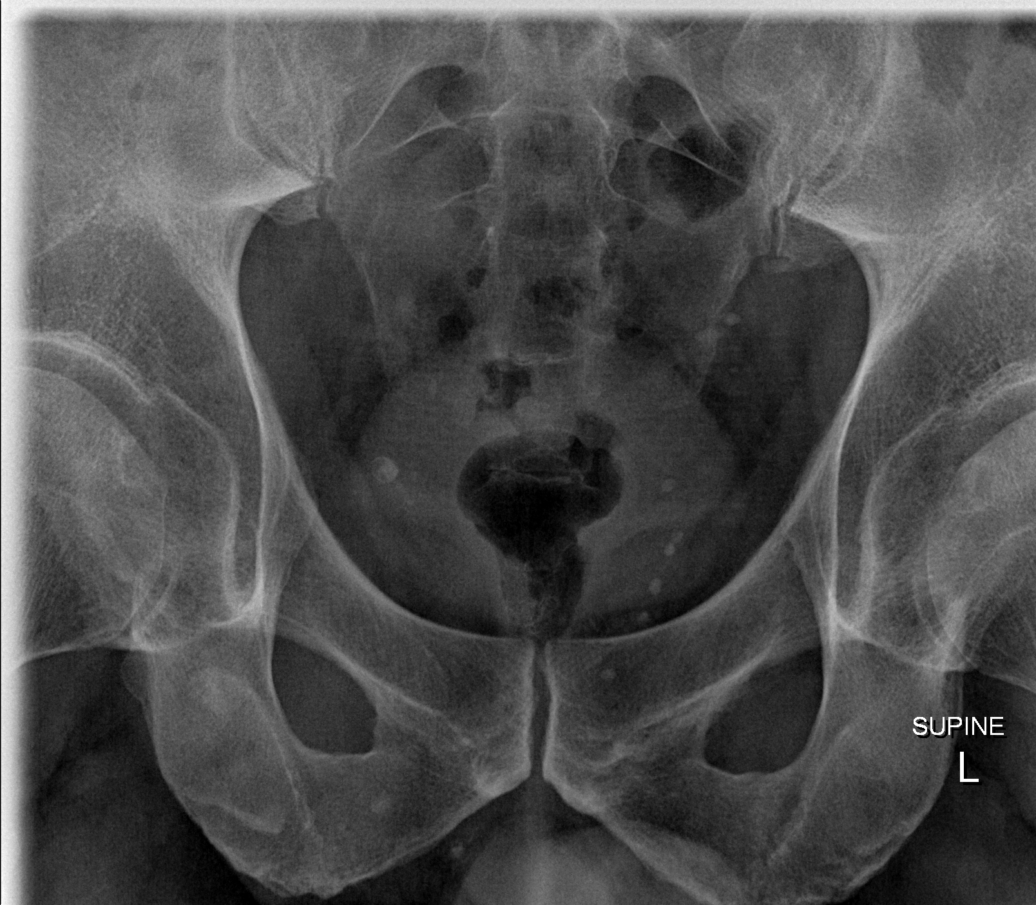

[4 of 4 positions shown; findings below may reference images not displayed]

FINDINGS: The lung fields are clear. There is no pneumothorax or large pleural
effusion. No focal infiltrate. The cardiac silhouette is
unremarkable. The trachea is midline. There is no acute osseous
abnormality. The visualized bowel gas pattern is unremarkable. There
are no radiopaque kidney stones. The stool burden is average. There
are multiple calcifications projecting over the patient's pelvis
that are statistically most likely to represent phleboliths.
IMPRESSION: 1. No acute cardiopulmonary disease.
2. Nonobstructive bowel gas pattern.
3. No radiopaque kidney stones.
